# Patient Record
Sex: Male | Born: 1985 | Race: Black or African American | Hispanic: No | Marital: Single | Smoking: Current every day smoker
Health system: Southern US, Community
[De-identification: ages and names within clinical notes are randomized; demographics above are authoritative.]

---

## 2011-06-12 ENCOUNTER — Emergency Department: Payer: Self-pay | Admitting: Emergency Medicine

## 2012-02-06 LAB — COMPREHENSIVE METABOLIC PANEL
Anion Gap: 8 (ref 7–16)
BUN: 11 mg/dL (ref 7–18)
Bilirubin,Total: 0.8 mg/dL (ref 0.2–1.0)
Chloride: 102 mmol/L (ref 98–107)
Co2: 28 mmol/L (ref 21–32)
Creatinine: 0.99 mg/dL (ref 0.60–1.30)
EGFR (African American): >60
EGFR (Non-African Amer.): >60
Osmolality: 275 (ref 275–301)
Potassium: 3.9 mmol/L (ref 3.5–5.1)
SGOT(AST): 12 U/L — ABNORMAL LOW (ref 15–37)
SGPT (ALT): 13 U/L

## 2012-02-06 LAB — CBC
MCHC: 34.1 g/dL (ref 32.0–36.0)
Platelet: 367 10*3/uL (ref 150–440)
RDW: 13 % (ref 11.5–14.5)
WBC: 19.6 10*3/uL — ABNORMAL HIGH (ref 3.8–10.6)

## 2012-02-06 LAB — URINALYSIS, COMPLETE
Bacteria: NONE SEEN
Bilirubin,UR: NEGATIVE
Blood: NEGATIVE
Glucose,UR: NEGATIVE mg/dL (ref 0–75)
Nitrite: NEGATIVE
RBC,UR: <1 /HPF (ref 0–5)
Specific Gravity: 1.02 (ref 1.003–1.030)
Squamous Epithelial: NONE SEEN
WBC UR: 1 /HPF (ref 0–5)

## 2012-02-07 ENCOUNTER — Inpatient Hospital Stay: Payer: Self-pay | Admitting: Surgery

## 2012-02-07 LAB — PROTIME-INR
INR: 0.9
Prothrombin Time: 12.6 secs (ref 11.5–14.7)

## 2012-02-07 LAB — AMYLASE: Amylase: 42 U/L (ref 25–115)

## 2012-02-07 LAB — LIPASE, BLOOD: Lipase: 58 U/L — ABNORMAL LOW (ref 73–393)

## 2012-02-12 LAB — PATHOLOGY REPORT
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
## 2012-12-03 ENCOUNTER — Emergency Department: Payer: Self-pay | Admitting: Emergency Medicine

## 2015-03-21 NOTE — H&P (Signed)
PATIENT NAME:  Jacob Schaefer, [name] MR#:  [id number] DATE OF BIRTH:  08-14-1986  DATE OF ADMISSION:  02/07/2012  HISTORY OF PRESENT ILLNESS: Mr. Jacob Schaefer is a 29 year old black male who experienced an episode of right upper quadrant abdominal pain associated with nausea and vomiting about 1 to 2 months ago. This resolved such that the patient was able to go to sleep and the following morning he was fine. He did well until about a week ago when he had a similar episode. He continued to do well until about 24 hours ago. After that episode he was able to sleep until about 8 o'clock in the morning and he has been up since with right upper quadrant pain, nausea, and vomiting. He has no documented fever but his girlfriend says that he was warm to the touch. The pain does not radiate to his back or shoulder or anywhere else. The patient has been crying since he has been in the Emergency Room (approximately six hours).   He denies change in the color of his urine or stool or sclerae.   PAST MEDICAL HISTORY: No medical illnesses.   MEDICATIONS: None.   ALLERGIES: None.   PAST SURGICAL HISTORY: None.   REVIEW OF SYSTEMS: Negative for 10 systems except as mentioned in the history of present illness above. The patient apparently has a fear of doctors and cries even when he has to go to the dentist to have a tooth pulled (according to his mother).   FAMILY HISTORY: Noncontributory.   SOCIAL HISTORY: The patient has had a previous served time in prison previously. He is currently out and unemployed. He lives with his father and his girlfriend (who is six months pregnant and is in attendance with him in the Emergency Room and supportive). He does not smoke cigarettes but does smoke marijuana socially. He has a social drinking binge with his family members on Saturdays where he drinks large quantities of tequila and vodka. He is sober and alcohol free for the other six days of the week.   PHYSICAL EXAMINATION:    GENERAL: Large black male with long dreadlocks who is obviously very upset and crying in the Emergency Room. Height 5 feet 11 inches, weight 220 pounds, BMI 30.7.   VITAL SIGNS: Temperature 98.4, pulse 86, respirations 18, blood pressure 133/88, oxygen saturation 98% on room air.   HEENT: Pupils equally round and reactive to light. Extraocular movements intact. Sclerae are nonicteric but are quite injected due to the patient's crying. Oropharynx is clear. Mucous membranes are moist.   NECK: Supple with no thyroid enlargement. The trachea is midline and there is no jugular venous distention.   HEART: Regular rate and rhythm with no murmurs or rubs.   LUNGS: Clear to auscultation with normal respiratory effort bilaterally.   ABDOMEN: Soft, nontender, nondistended with no palpable hepatosplenomegaly or other masses.   EXTREMITIES: No edema with normal capillary refill bilaterally.   NEUROLOGIC: Cranial nerves II through XII, motor and sensation grossly intact.   PSYCHIATRIC: Alert and oriented x4. The patient is considerably fearful and continued to cry. He cannot state what his concern is other than he does not want admission to the hospital, is scared of needles, is afraid of an operation, et Karie Sodacetera. This is despite consolation by his girlfriend and his mother.   LABORATORY, DIAGNOSTIC, AND RADIOLOGICAL DATA: White blood cell count 19.6, hemoglobin 15.7, hematocrit 46%, platelet count 367,000. PT 13. INR 0.9. Electrolytes entirely normal. Lipase normal. Hepatic profile normal. Ultrasound  reveals a stone in the gallbladder with thickened gallbladder wall and trace pericholecystic fluid and a positive sonographic Murphy's sign. There was no measurement mentioned for the extrahepatic bile ducts but it was noted that there was no biliary dilatation.    ASSESSMENT:  1. Acute cholecystitis.  2. Significant fear and crying.   PLAN:  1. Admit to the hospital for IV fluid hydration, IV  antibiotics, laparoscopic cholecystectomy.  2. For his crying, I will try an antipsychotic such as Haldol which may have benefit of affording him some sleep and helping with his nausea as well.   ____________________________ Claude Manges, MD wfm:drc D: 02/07/2012 02:12:55 ET T: 02/07/2012 07:35:40 ET JOB#: [id number]  cc: Claude Manges, MD, <Dictator> Claude Manges MD ELECTRONICALLY SIGNED 02/07/2012 22:57

## 2015-03-21 NOTE — Op Note (Signed)
PATIENT NAME:  Jacob Schaefer, Jacob Schaefer MR#:  [id number] DATE OF BIRTH:  1986-11-10  DATE OF PROCEDURE:  02/07/2012  PREOPERATIVE DIAGNOSIS: Acute calculus cholecystitis.   POSTOPERATIVE DIAGNOSIS: Acute calculus cholecystitis.     PROCEDURE PERFORMED: Laparoscopic cholecystectomy.   SURGEON: Cierra Rothgeb A. Egbert GaribaldiBird, M.D.   ASSISTANT: Surgical scrub technologist.  TYPE OF ANESTHESIA: General endotracheal.   FINDINGS: Acute cholecystitis, contents gallbladder with stones.   DESCRIPTION OF PROCEDURE: With the patient in the supine position, general endotracheal anesthesia was induced. His left arm was padded and tucked at his side. His abdomen was widely prepped and draped with ChloraPrep solution. Time-out was observed.   A 12 mm blunt Hassan trocar was placed under direct visualization and pneumoperitoneum was established. A 5 mm Bladeless trocar was placed in the epigastrium. Two 5-mm ports were then placed in the right subcostal margin lateral to the rectus sheath. The gallbladder was tensely distended and consistent with acute calculus cholecystitis. It was aspirated with 10 mL of clear bile. The gallbladder was then grasped along its fundus and elevated towards the right shoulder. Lateral traction was placed on Hartman's pouch. The cystic triangle of Calot was dissected with a combination of blunt dissection and hook cautery. The cystic duct was identified, triply clipped on the portal side, singly clipped on the gallbladder side and divided. Immediately adjacent, a single cystic artery branch was identified, doubly clipped on the portal side, singly clipped on the gallbladder side and divided. Further dissection in this area demonstrated no aberrant bile duct artery. The gallbladder was then retrieved off the gallbladder fossa utilizing hook cautery apparatus in midportion. Upwards on the gallbladder fossa a small diminutive posterior branch of the cystic artery was divided between hemoclips. The gallbladder was  then fully retrieved off the gallbladder fossa, captured in an EndoCatch device and retrieved. Pneumoperitoneum was re-established. The right upper quadrant was irrigated with a total of 1 liter of normal saline, aspirated dry and point hemostasis obtained in the gallbladder fossa with electrocautery. A 19 mm Blake drain was directed into the space and exited the lowermost right upper quadrant port site securing its exit site with nylon suture. Ports were then removed under direct visualization. There appeared to be no bleeding or bile staining and no evidence of bowel injury. A total of 30 mL of 0.25% plain Marcaine was infiltrated during the course of the operation into the skin and fascia. The infraumbilical fascial defect was closed with an additional figure-of-eight #0 Vicryl suture in vertical orientation. 4-0 Vicryl was used to reapproximate the skin edges followed by the application of benzoin, Steri-Strips, and occlusive sterile dressing. The patient was then subsequently extubated and taken to the recovery room in stable and satisfactory condition by anesthesia services.  ____________________________ Redge GainerMark A. Egbert GaribaldiBird, MD mab:ap D: 02/08/2012 13:54:44 ET T: 02/08/2012 15:23:47 ET JOB#: [id number]  cc: Loraine LericheMark A. Egbert GaribaldiBird, MD, <Dictator> Raynald KempMARK A Kay Ricciuti MD ELECTRONICALLY SIGNED 02/09/2012 9:51

## 2021-01-19 ENCOUNTER — Other Ambulatory Visit: Payer: Self-pay

## 2021-01-19 ENCOUNTER — Encounter: Payer: Self-pay | Admitting: Emergency Medicine

## 2021-01-19 ENCOUNTER — Emergency Department
Admission: EM | Admit: 2021-01-19 | Discharge: 2021-01-19 | Disposition: A | Discharge status: Home / Self Care | Payer: 59 | Attending: Emergency Medicine | Admitting: Emergency Medicine

## 2021-01-19 DIAGNOSIS — Y990 Civilian activity done for income or pay: Secondary | ICD-10-CM | POA: Diagnosis not present

## 2021-01-19 DIAGNOSIS — S0501XA Injury of conjunctiva and corneal abrasion without foreign body, right eye, initial encounter: Secondary | ICD-10-CM | POA: Diagnosis not present

## 2021-01-19 DIAGNOSIS — F17210 Nicotine dependence, cigarettes, uncomplicated: Secondary | ICD-10-CM | POA: Insufficient documentation

## 2021-01-19 DIAGNOSIS — W312XXA Contact with powered woodworking and forming machines, initial encounter: Secondary | ICD-10-CM | POA: Insufficient documentation

## 2021-01-19 DIAGNOSIS — S0591XA Unspecified injury of right eye and orbit, initial encounter: Secondary | ICD-10-CM | POA: Diagnosis present

## 2021-01-19 MED ORDER — FLUORESCEIN SODIUM 1 MG OP STRP
1.0000 | ORAL_STRIP | Freq: Once | OPHTHALMIC | Status: AC
  Administered 2021-01-19: 1 via OPHTHALMIC
  Filled 2021-01-19: qty 1

## 2021-01-19 MED ORDER — POLYMYXIN B-TRIMETHOPRIM 10000-0.1 UNIT/ML-% OP SOLN: 2.0000 [drp] | Freq: Four times a day (QID) | OPHTHALMIC | 0 refills | Status: AC

## 2021-01-19 MED ORDER — KETOROLAC TROMETHAMINE 0.5 % OP SOLN: 1.0000 [drp] | Freq: Four times a day (QID) | OPHTHALMIC | 0 refills | Status: AC

## 2021-01-19 MED ORDER — TETRACAINE HCL 0.5 % OP SOLN
2.0000 [drp] | Freq: Once | OPHTHALMIC | Status: AC
  Administered 2021-01-19: 2 [drp] via OPHTHALMIC
  Filled 2021-01-19: qty 4

## 2021-01-19 NOTE — ED Triage Notes (Signed)
Pt comes into the ED via POV c/o right eye pain that started today. Pt presents with redness and watering eyes.  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

## 2021-01-19 NOTE — ED Notes (Signed)
Got something in his right eye earlier today. Denies any vision changes.

## 2021-01-19 NOTE — ED Provider Notes (Signed)
[facility] Emergency Department Provider Note  ____________________________________________  Time seen: Approximately 5:59 PM  I have reviewed the triage vital signs and the nursing notes.   HISTORY  Chief Complaint Eye Pain    HPI Jacob Schaefer is a 35 y.o. male who presents the emergency department complaining of possible foreign body to the right eye.  Patient states that he was using a saw, felt like some wood chips it flew into his right eye.  Since then he has had ongoing foreign body sensation.  He is not wear glasses or contacts.  No vision changes.  Patient states that the foreign body sensation appears to be moving about his eye and is in different locations at different times.  Patient has used some artificial tears prior to arrival.  No other complaints at this time.         History reviewed. No pertinent past medical history.  There are no problems to display for this patient.   History reviewed. No pertinent surgical history.  Prior to Admission medications   Medication Sig Start Date End Date Taking? Authorizing Provider  ketorolac (ACULAR) 0.5 % ophthalmic solution Place 1 drop into the right eye 4 (four) times daily. 01/19/21  Yes Divonte Senger, Delorise Royals, PA-C  trimethoprim-polymyxin b (POLYTRIM) ophthalmic solution Place 2 drops into the right eye every 6 (six) hours. 01/19/21  Yes Jacksyn Beeks, Delorise Royals, PA-C    Allergies Patient has no known allergies.  History reviewed. No pertinent family history.  Social History Social History   Tobacco Use  . Smoking status: Current Every Day Smoker    Packs/day: 0.50    Types: Cigarettes  . Smokeless tobacco: Never Used  Substance Use Topics  . Alcohol use: Yes    Comment: weekends  . Drug use: Yes    Types: Marijuana     Review of Systems  Constitutional: No fever/chills Eyes: Foreign body sensation to the right eye ENT: No upper respiratory complaints. Cardiovascular: no chest  pain. Respiratory: no cough. No SOB. Gastrointestinal: No abdominal pain.  No nausea, no vomiting.  No diarrhea.  No constipation. Musculoskeletal: Negative for musculoskeletal pain. Skin: Negative for rash, abrasions, lacerations, ecchymosis. Neurological: Negative for headaches, focal weakness or numbness.  10 System ROS otherwise negative.  ____________________________________________   PHYSICAL EXAM:  VITAL SIGNS: ED Triage Vitals  Enc Vitals Group     BP 01/19/21 1748 (!) 142/90     Pulse Rate 01/19/21 1748 87     Resp 01/19/21 1748 19     Temp 01/19/21 1748 98.5 F (36.9 C)     Temp Source 01/19/21 1748 Oral     SpO2 --      Weight 01/19/21 1748 254 lb (115.2 kg)     Height 01/19/21 1748 5\' 11"  (1.803 m)     Head Circumference --      Peak Flow --      Pain Score 01/19/21 1747 10     Pain Loc --      Pain Edu? --      Excl. in GC? --      Constitutional: Alert and oriented. Well appearing and in no acute distress. Eyes: Conjunctivae are normal. PERRL. EOMI. visualization with funduscopic reveals no good red reflex bilaterally.  Vasculature and optic disc is unremarkable right side.  Visualization of the cranium reveals no visible foreign body or acute abrasion.  Eye is anesthetized using tetracaine drops.  Fluorescein staining applied with area of uptake in the  11 and 12 o'clock position consistent with corneal abrasion.  No visualized foreign body after fluorescein staining Head: Atraumatic. ENT:      Ears:       Nose: No congestion/rhinnorhea.      Mouth/Throat: Mucous membranes are moist.  Neck: No stridor.    Cardiovascular: Normal rate, regular rhythm. Normal S1 and S2.  Good peripheral circulation. Respiratory: Normal respiratory effort without tachypnea or retractions. Lungs CTAB. Good air entry to the bases with no decreased or absent breath sounds. Musculoskeletal: Full range of motion to all extremities. No gross deformities appreciated. Neurologic:   Normal speech and language. No gross focal neurologic deficits are appreciated.  Skin:  Skin is warm, dry and intact. No rash noted. Psychiatric: Mood and affect are normal. Speech and behavior are normal. Patient exhibits appropriate insight and judgement.   ____________________________________________   LABS (all labs ordered are listed, but only abnormal results are displayed)  Labs Reviewed - No data to display ____________________________________________  EKG   ____________________________________________  RADIOLOGY   No results found.  ____________________________________________    PROCEDURES  Procedure(s) performed:    Procedures    Medications  fluorescein ophthalmic strip 1 strip (1 strip Right Eye Given by Other 01/19/21 1804)  tetracaine (PONTOCAINE) 0.5 % ophthalmic solution 2 drop (2 drops Right Eye Given by Other 01/19/21 1804)     ____________________________________________   INITIAL IMPRESSION / ASSESSMENT AND PLAN / ED COURSE  Pertinent labs & imaging results that were available during my care of the patient were reviewed by me and considered in my medical decision making (see chart for details).  Review of the  [place]RS was performed in accordance of the [facility] prior to dispensing any controlled drugs.           Patient's diagnosis is consistent with corneal abrasion.  Patient presented to the emergency department with foreign body sensation to the left eye.  Patient states that he believes a small wood chip had flown into his eye.  No visualized retained foreign body.  Physical exam was consistent with corneal abrasion.  Patiently placed on Polytrim and Acular drops.  Follow-up with ophthalmology as needed.  Return precautions discussed with the patient.  Differential included corneal abrasion versus foreign body..  Patient is given ED precautions to return to the ED for any worsening or new  symptoms.     ____________________________________________  FINAL CLINICAL IMPRESSION(S) / ED DIAGNOSES  Final diagnoses:  Abrasion of right cornea, initial encounter      NEW MEDICATIONS STARTED DURING THIS VISIT:  ED Discharge Orders         Ordered    trimethoprim-polymyxin b (POLYTRIM) ophthalmic solution  Every 6 hours        01/19/21 1829    ketorolac (ACULAR) 0.5 % ophthalmic solution  4 times daily        01/19/21 1829              This chart was dictated using voice recognition software/Dragon. Despite best efforts to proofread, errors can occur which can change the meaning. Any change was purely unintentional.    Racheal Patches, PA-C 01/19/21 Silverio Lay, MD 01/19/21 [phone]

## 2023-11-27 ENCOUNTER — Encounter: Payer: Self-pay | Admitting: Family Medicine

## 2023-11-27 ENCOUNTER — Ambulatory Visit: Payer: Self-pay | Admitting: Family Medicine

## 2023-11-27 DIAGNOSIS — Z113 Encounter for screening for infections with a predominantly sexual mode of transmission: Secondary | ICD-10-CM

## 2023-11-27 DIAGNOSIS — Z202 Contact with and (suspected) exposure to infections with a predominantly sexual mode of transmission: Secondary | ICD-10-CM

## 2023-11-27 LAB — HM HIV SCREENING LAB: HM HIV Screening: NEGATIVE

## 2023-11-27 MED ORDER — DOXYCYCLINE HYCLATE 100 MG PO TABS: 100.0000 mg | ORAL_TABLET | Freq: Two times a day (BID) | ORAL | Status: AC

## 2023-11-27 NOTE — Progress Notes (Signed)
 [facility] Department STI clinic [address], [address] [place] [address] Main phone: [phone]  STI screening visit  Subjective:  Jacob Schaefer is a 37 y.o. male being seen today for an STI screening visit. The patient reports they do not have symptoms.    Patient has the following medical conditions:  There are no active problems to display for this patient.   Chief Complaint  Patient presents with   SEXUALLY TRANSMITTED DISEASE    Screening    HPI  Patient reports to clinic after the woman he slept with reports she had Chlamydia- denies symptoms  Last HIV test per patient/review of record was No results found for: HMHIVSCREEN No results found for: HIV  Last HEPC test per patient/review of record was No results found for: HMHEPCSCREEN No components found for: HEPC   Last HEPB test per patient/review of record was No components found for: HMHEPBSCREEN No components found for: HEPC   Does the patient or their partner desires a pregnancy in the next year? No  Screening for MPX risk: Does the patient have an unexplained rash? No Is the patient MSM? No Does the patient endorse multiple sex partners or anonymous sex partners? No Did the patient have close or sexual contact with a person diagnosed with MPX? No Has the patient traveled outside the [place]  where MPX is endemic? No Is there a high clinical suspicion for MPX-- evidenced by one of the following No  -Unlikely to be chickenpox  -Lymphadenopathy  -Rash that present in same phase of evolution on any given body part   See flowsheet for further details and programmatic requirements.    There is no immunization history on file for this patient.   The following portions of the patient's history were reviewed and updated as appropriate: allergies, current medications, past medical history, past social history, past surgical history and problem list.  Objective:  There were no  vitals filed for this visit.  Physical Exam Vitals and nursing note reviewed.  Constitutional:      Appearance: Normal appearance.  HENT:     Head: Normocephalic and atraumatic.     Mouth/Throat:     Mouth: Mucous membranes are moist.     Pharynx: No oropharyngeal exudate or posterior oropharyngeal erythema.  Eyes:     General:        Right eye: No discharge.        Left eye: No discharge.     Conjunctiva/sclera:     Right eye: Right conjunctiva is not injected. No exudate.    Left eye: Left conjunctiva is not injected. No exudate. Pulmonary:     Effort: Pulmonary effort is normal.  Abdominal:     General: Abdomen is flat.     Palpations: Abdomen is soft. There is no hepatomegaly or mass.     Tenderness: There is no abdominal tenderness. There is no rebound.  Genitourinary:    Comments: Declined genital exam- asymptomatic Lymphadenopathy:     Cervical: No cervical adenopathy.     Upper Body:     Right upper body: No supraclavicular or axillary adenopathy.     Left upper body: No supraclavicular or axillary adenopathy.  Skin:    General: Skin is warm and dry.  Neurological:     Mental Status: He is alert and oriented to person, place, and time.     Assessment and Plan:  Jacob Schaefer is a 37 y.o. male presenting to the [facility]  Department for STI screening  1. Screening for venereal disease (Primary)  - HIV [facility] - Syphilis Serology, [place] [facility] - Chlamydia/GC NAA, Confirmation  2. Exposure to chlamydia  - doxycycline  (VIBRA -TABS) 100 MG tablet; Take 1 tablet (100 mg total) by mouth 2 (two) times daily for 7 days.   Patient does not have STI symptoms Patient accepted all screenings including  urine GC/Chlamydia, and blood work for HIV/Syphilis. Patient meets criteria for HepB screening? No. Ordered? not applicable Patient meets criteria for HepC screening? No. Ordered? not applicable Recommended condom use with all sex Discussed  importance of condom use for STI prevention  Treat positive test results per standing order. Discussed time line for [facility] results and that patient will be called with positive results and encouraged patient to call if he had not heard in 2 weeks Recommended repeat testing in 3 months with positive results. Recommended returning for continued or worsening symptoms.   Return if symptoms worsen or fail to improve, for STI screening.  No future appointments.  Verneta Bers, [place]

## 2023-11-27 NOTE — Progress Notes (Signed)
 Pt is here for STD screening.   The patient was dispensed Doxycycline  100 mg #14 today. I provided counseling today regarding the medication. We discussed the medication, the side effects and when to call clinic. Patient given the opportunity to ask questions. Questions answered.  Condoms declined.  Maudie CHRISTELLA Edison, RN

## 2023-11-29 LAB — CHLAMYDIA/GC NAA, CONFIRMATION
Chlamydia trachomatis, NAA: NEGATIVE
Neisseria gonorrhoeae, NAA: NEGATIVE
